# Patient Record
Sex: Male | Born: 1995 | Race: Black or African American | Hispanic: No | Marital: Single | Smoking: Never smoker
Health system: Southern US, Community
[De-identification: ages and names within clinical notes are randomized; demographics above are authoritative.]

## PROBLEM LIST (undated history)

## (undated) HISTORY — PX: WISDOM TOOTH EXTRACTION: SHX21

---

## 2015-10-23 ENCOUNTER — Ambulatory Visit (INDEPENDENT_AMBULATORY_CARE_PROVIDER_SITE_OTHER): Payer: BC Managed Care – PPO | Admitting: Family Medicine

## 2015-10-23 ENCOUNTER — Ambulatory Visit (INDEPENDENT_AMBULATORY_CARE_PROVIDER_SITE_OTHER): Payer: BC Managed Care – PPO

## 2015-10-23 VITALS — BP 120/70 | HR 75 | Temp 98.4°F | Resp 16 | Ht 70.0 in | Wt 183.0 lb

## 2015-10-23 DIAGNOSIS — S91312S Laceration without foreign body, left foot, sequela: Secondary | ICD-10-CM

## 2015-10-23 DIAGNOSIS — M25572 Pain in left ankle and joints of left foot: Secondary | ICD-10-CM

## 2015-10-23 MED ORDER — NAPROXEN 500 MG PO TABS: 500.0000 mg | ORAL_TABLET | Freq: Two times a day (BID) | ORAL | Status: AC

## 2015-10-23 NOTE — Patient Instructions (Addendum)
Take naproxen 500 mg one twice daily with breakfast and supper for pain and inflammation and foot  Continue to wear your boots to give the foot added support for the next week or 2.   If pain persists over the next 10 days or so please return for a recheck. Come sooner if needed.    IF you received an x-ray today, you will receive an invoice from [facility]. Please contact [facility] at [phone] with questions or concerns regarding your invoice.   IF you received labwork today, you will receive an invoice from [facility]. Please contact [facility] at [phone] with questions or concerns regarding your invoice.   Our billing staff will not be able to assist you with questions regarding bills from these companies.  You will be contacted with the lab results as soon as they are available. The fastest way to get your results is to activate your My Chart account. Instructions are located on the last page of this paperwork. If you have not heard from us regarding the results in 2 weeks, please contact this office.

## 2015-10-23 NOTE — Progress Notes (Addendum)
Patient ID: Tony Cherry, male    DOB: [birth year]  Age: 20 y.o. MRN: [id number]  Chief Complaint  Patient presents with  . Foot Pain    right, x 1 month    Subjective:   20 year old man who was on spring break a month or so ago and stepped on something on his left foot. It may have been a piercing. He said it bled considerably, and indeed his photograph confirms that. It seemed to slowly heal up, but then he is now developed a pain over the third fourth metatarsal area that same foot. It hurts to stand on it. He does work out some. He is a Consulting civil engineer at [employer].  Current allergies, medications, problem list, past/family and social histories reviewed.  Objective:  BP 120/70 mmHg  Pulse 75  Temp(Src) 98.4 F (36.9 C)  Resp 16  Ht  (1.778 m)  Wt 183 lb (83.008 kg)  BMI 26.26 kg/m2  No major acute distress. Has a callused area around the bunion area of the first metatarsal. This had a little crack in the callus.  The callus and the the deeper tissues all look fine. There is scarring from the cut that he had received. That area is not tender. However at the proximal third of the third and fourth metatarsal there is tenderness. It hurts him to bear weight. No erythema or swelling.  Assessment & Plan:   Assessment: 1. Pain in joint, ankle and foot, left   2. Laceration of left foot, sequela       Plan: X-ray foot  Orders Placed This Encounter  Procedures  . DG Foot Complete Left    Order Specific Question:  Reason for Exam (SYMPTOM  OR DIAGNOSIS REQUIRED)    Answer:  pain 3rd-4th metatarsal.  possible fb at first mtp    Order Specific Question:  Preferred imaging location?    Answer:  External    Meds ordered this encounter  Medications  . amphetamine-dextroamphetamine (ADDERALL) 30 MG tablet    Sig: Take 30 mg by mouth daily.  . naproxen (NAPROSYN) 500 MG tablet    Sig: Take 1 tablet (500 mg total) by mouth 2 (two) times daily with  a meal.    Dispense:  30 tablet    Refill:  0    Foot x-ray showed a little abnormality on the third metatarsal. However the radiologist discuss it with me and he feels strongly that this is a nutrient vessel and no evidence of fracture. Bodies were noted.  Will treat for a stretch injury of the foot and give it a little time to heal. If not doing better we will reassess.     Patient Instructions   Take naproxen 500 mg one twice daily with breakfast and supper for pain and inflammation and foot  Continue to wear your boots to give the foot added support for the next week or 2.   If pain persists over the next 10 days or so please return for a recheck. Come sooner if needed.    IF you received an x-ray today, you will receive an invoice from [facility]. Please contact [facility] at [phone] with questions or concerns regarding your invoice.   IF you received labwork today, you will receive an invoice from [facility]. Please contact [facility] at [phone] with questions or concerns regarding your invoice.   Our billing staff will not be able to assist you with questions regarding bills from  these companies.  You will be contacted with the lab results as soon as they are available. The fastest way to get your results is to activate your My Chart account. Instructions are located on the last page of this paperwork. If you have not heard from us regarding the results in 2 weeks, please contact this office.          Return if symptoms worsen or fail to improve.   HOPPER,DAVID, MD 10/23/2015

## 2017-08-20 IMAGING — CR DG FOOT COMPLETE 3+V*L*
3 series · 3 of 3 positions shown · non-contrast
Comparison: None.

ADDENDUM:
On the oblique view, the cortical irregularity along the medial
aspect of the proximal 3rd metatarsal is favored to reflect a
nutrient/vascular channel, given the characteristic appearance, and
is not considered suspicious for fracture. Additionally, there are
no associated findings on the frontal or lateral views.
CLINICAL DATA: Pain at 3rd/4th metatarsal, possible foreign body at
1st MTP joint

EXAM:
LEFT FOOT - COMPLETE 3+ VIEW

[AP]
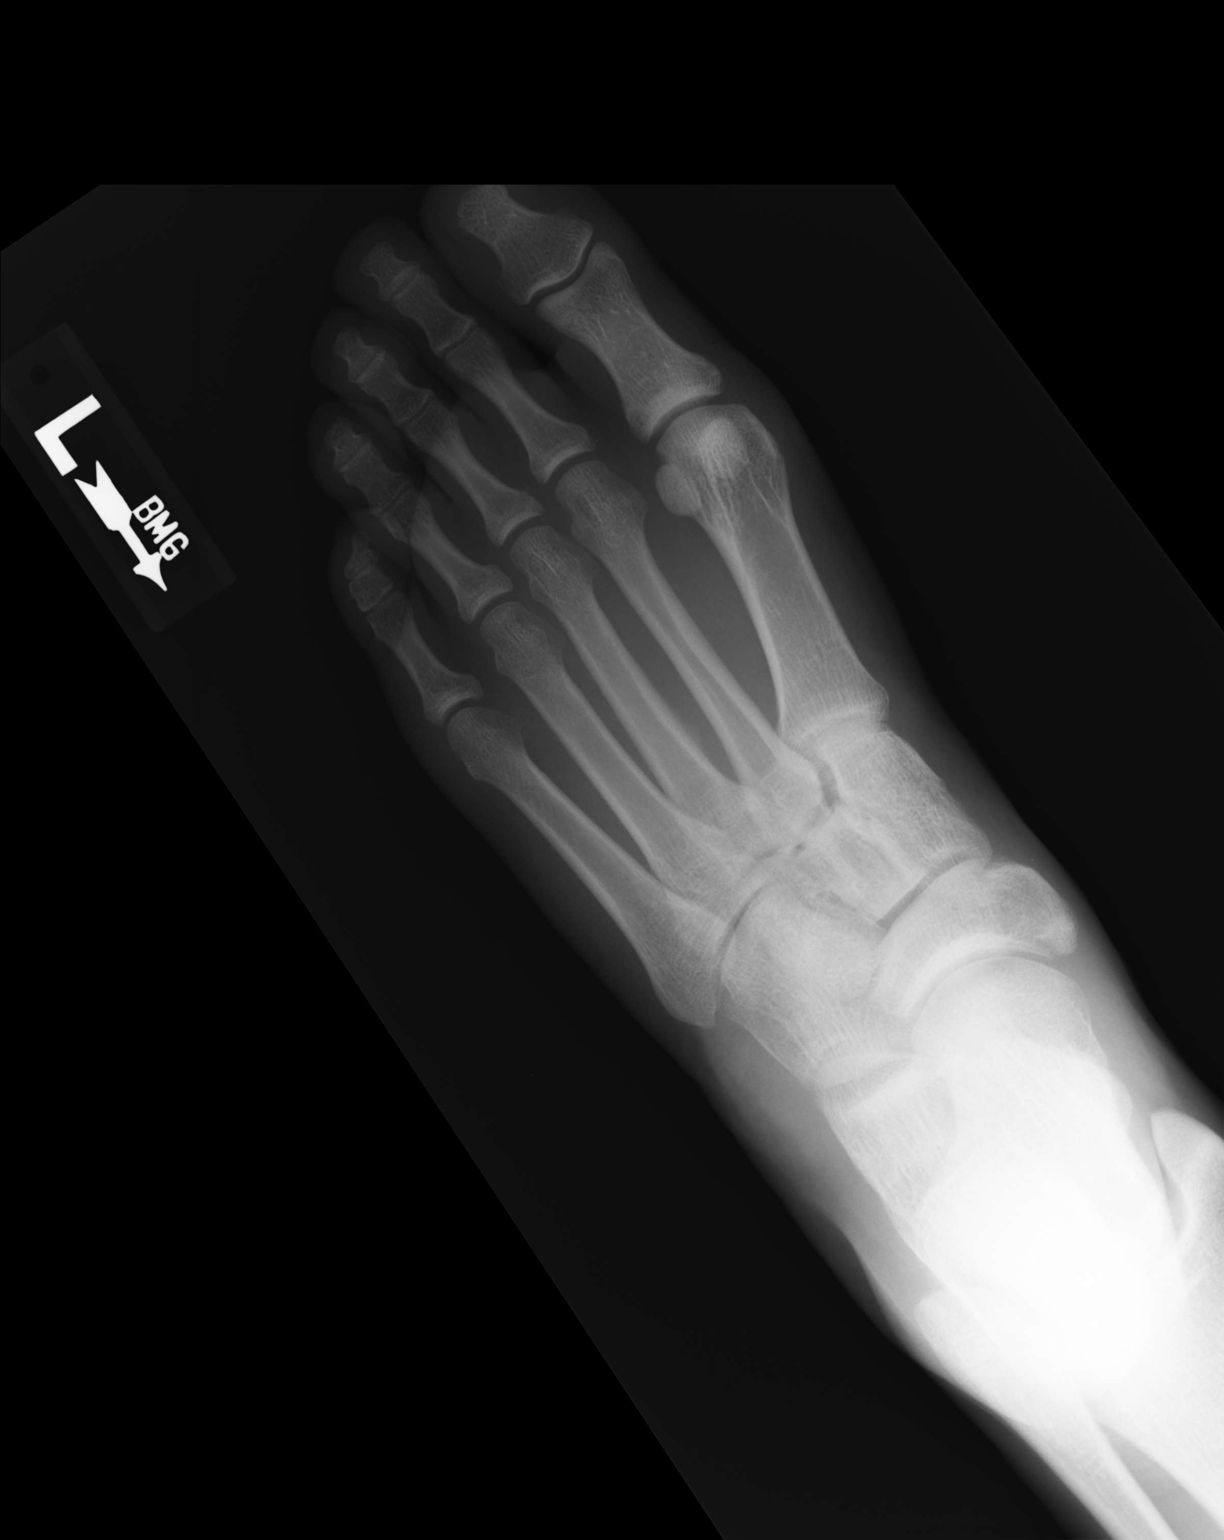

[ap obl int rot]
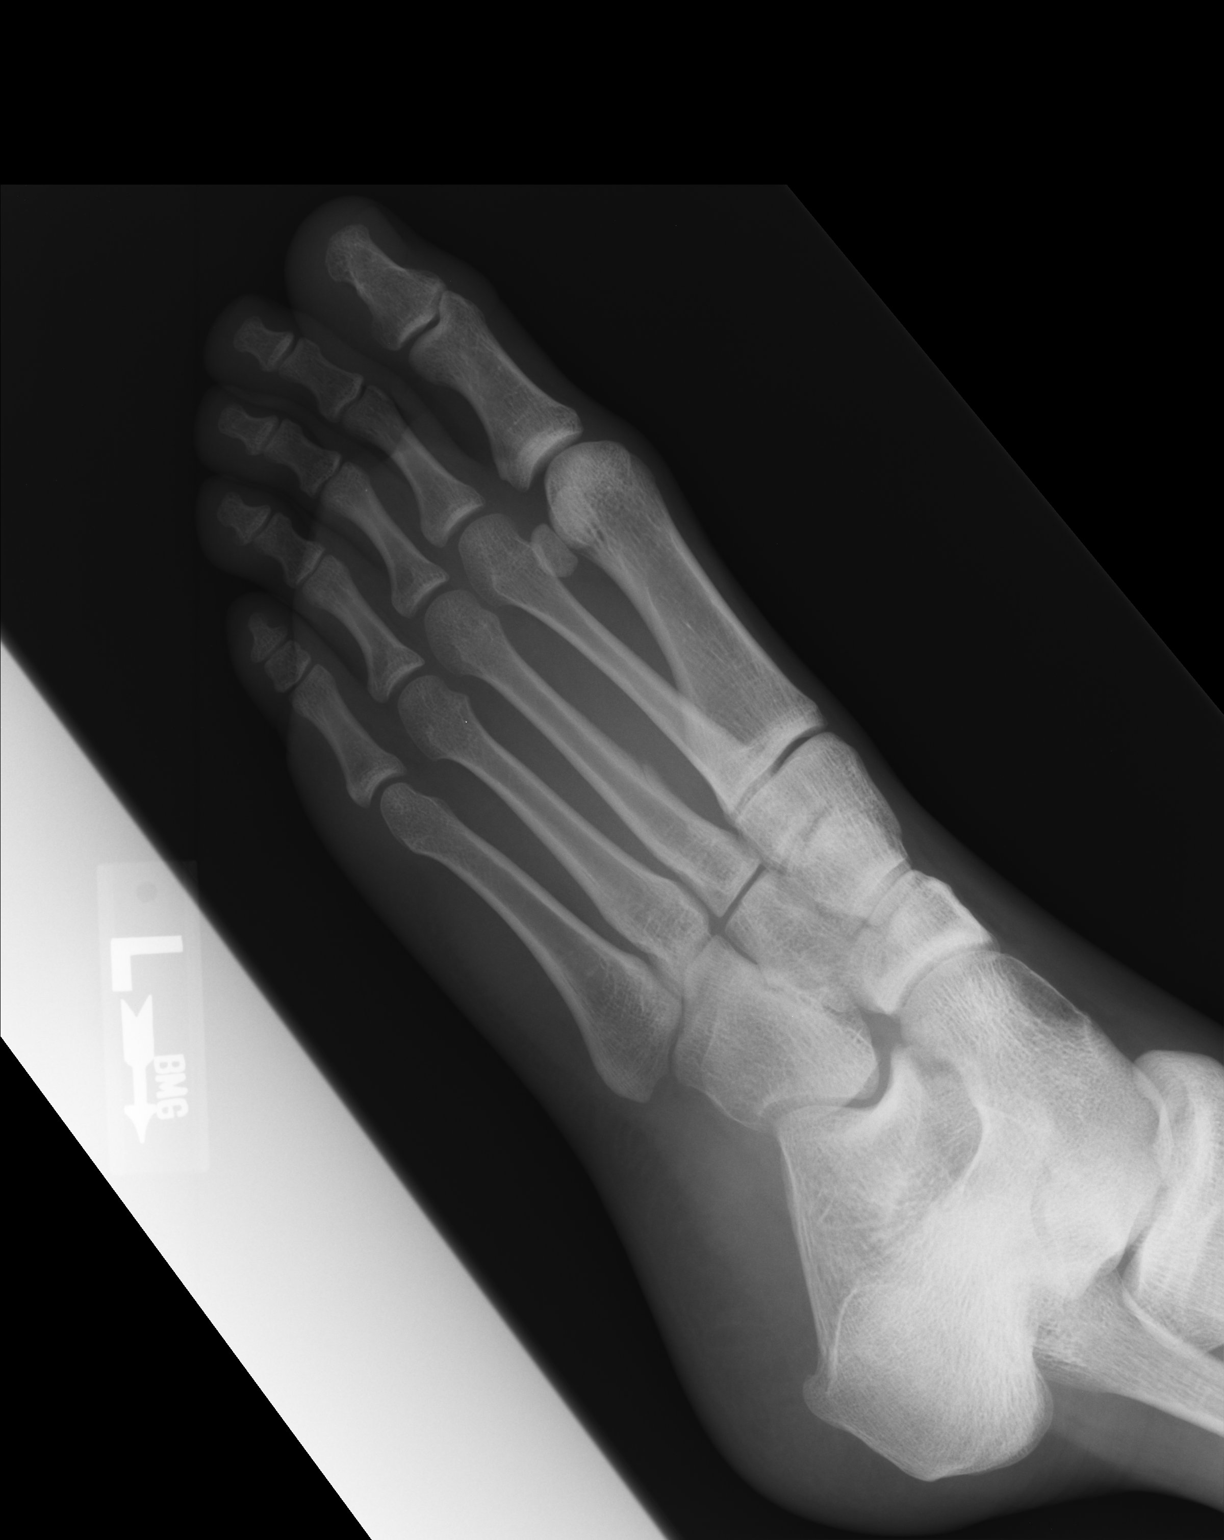

[lateral]
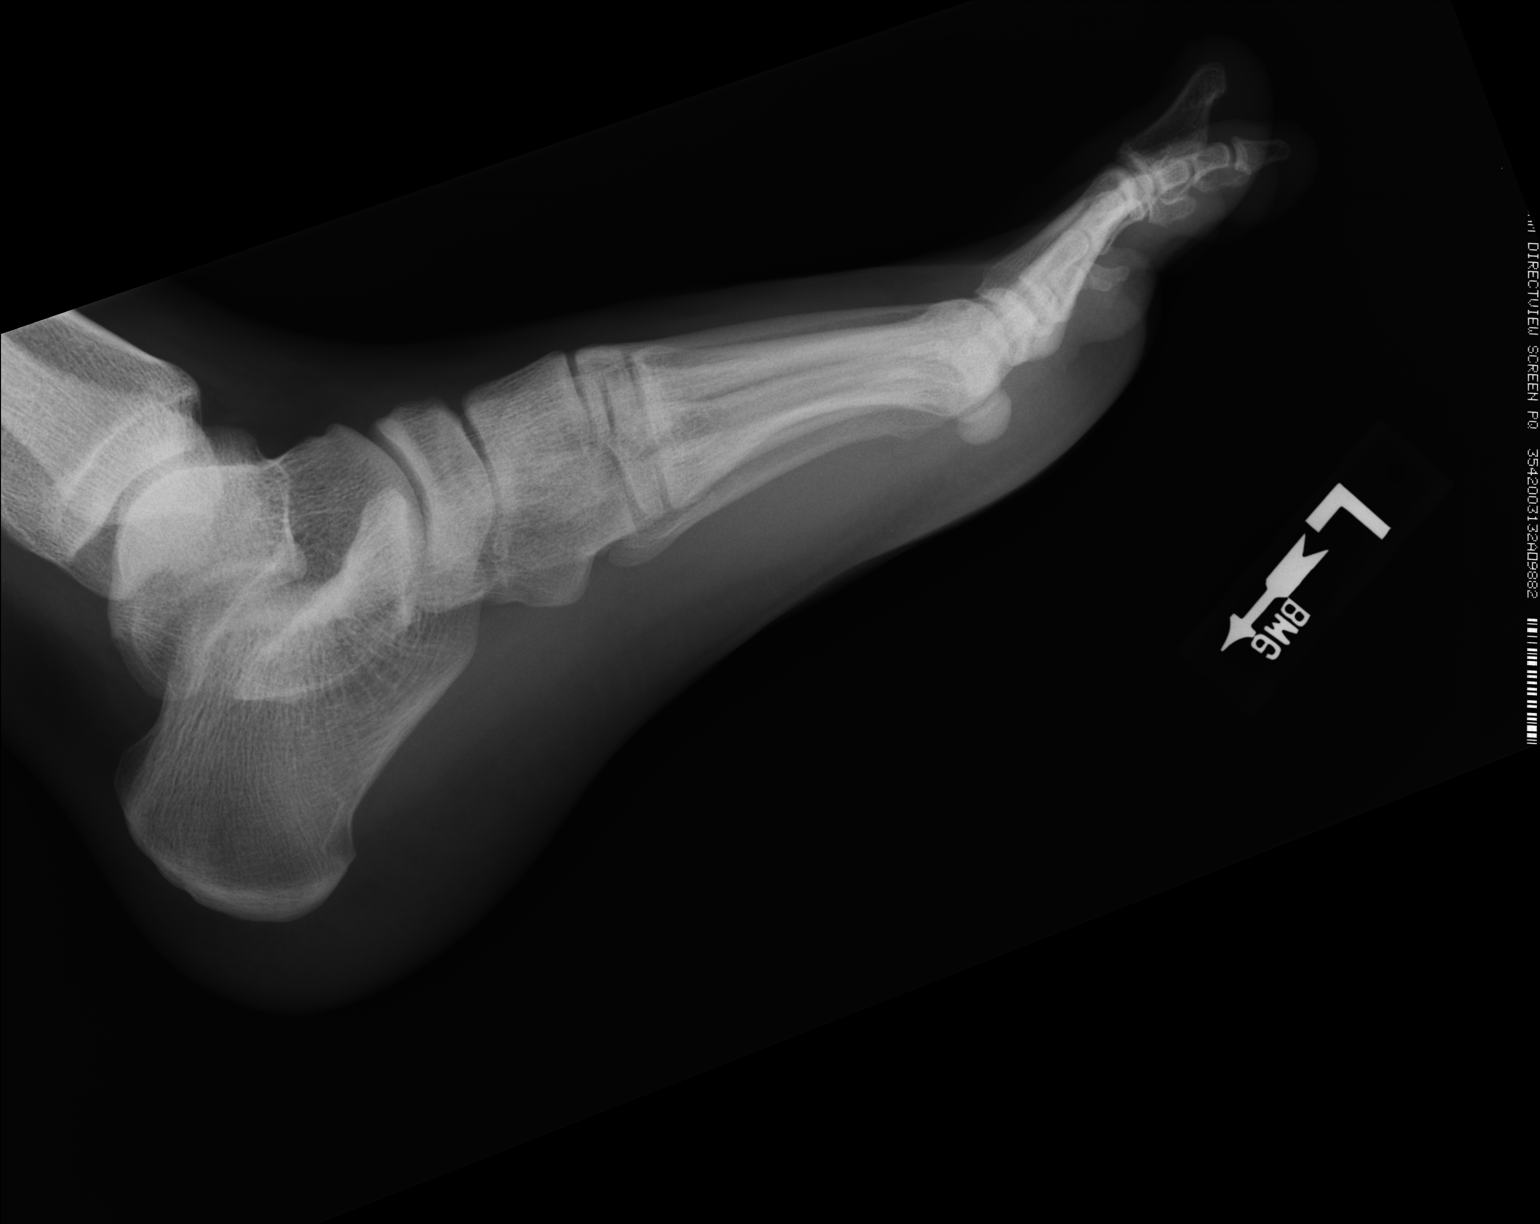

[3 of 3 positions shown; findings below may reference images not displayed]

FINDINGS: No fracture or dislocation is seen.

The joint spaces are preserved.

Visualized soft tissues are within normal limits.

No radiopaque foreign body is seen.
IMPRESSION: No fracture, dislocation, or radiopaque foreign body is seen.
